# Patient Record
Sex: Female | Born: 1990 | Race: White | Hispanic: No | Marital: Married | Smoking: Current every day smoker
Health system: Southern US, Community
[De-identification: ages and names within clinical notes are randomized; demographics above are authoritative.]

## PROBLEM LIST (undated history)

## (undated) DIAGNOSIS — B1920 Unspecified viral hepatitis C without hepatic coma: Secondary | ICD-10-CM

## (undated) DIAGNOSIS — I38 Endocarditis, valve unspecified: Secondary | ICD-10-CM

## (undated) DIAGNOSIS — F1990 Other psychoactive substance use, unspecified, uncomplicated: Secondary | ICD-10-CM

## (undated) HISTORY — PX: CHOLECYSTECTOMY: SHX55

---

## 2015-09-07 ENCOUNTER — Encounter: Payer: Self-pay | Admitting: Emergency Medicine

## 2015-09-07 ENCOUNTER — Emergency Department: Payer: Self-pay

## 2015-09-07 ENCOUNTER — Emergency Department
Admission: EM | Admit: 2015-09-07 | Discharge: 2015-09-07 | Disposition: A | Discharge status: Home / Self Care | Payer: Self-pay | Attending: Emergency Medicine | Admitting: Emergency Medicine

## 2015-09-07 DIAGNOSIS — J069 Acute upper respiratory infection, unspecified: Secondary | ICD-10-CM | POA: Insufficient documentation

## 2015-09-07 DIAGNOSIS — R079 Chest pain, unspecified: Secondary | ICD-10-CM | POA: Insufficient documentation

## 2015-09-07 DIAGNOSIS — F17200 Nicotine dependence, unspecified, uncomplicated: Secondary | ICD-10-CM | POA: Insufficient documentation

## 2015-09-07 HISTORY — DX: Unspecified viral hepatitis C without hepatic coma: B19.20

## 2015-09-07 HISTORY — DX: Other psychoactive substance use, unspecified, uncomplicated: F19.90

## 2015-09-07 HISTORY — DX: Endocarditis, valve unspecified: I38

## 2015-09-07 LAB — BASIC METABOLIC PANEL
ANION GAP: 6 (ref 5–15)
BUN: 10 mg/dL (ref 6–20)
CALCIUM: 9.1 mg/dL (ref 8.9–10.3)
CHLORIDE: 108 mmol/L (ref 101–111)
CO2: 24 mmol/L (ref 22–32)
Creatinine, Ser: 0.89 mg/dL (ref 0.44–1.00)
GFR calc non Af Amer: >60 mL/min (ref >60)
Glucose, Bld: 91 mg/dL (ref 65–99)
POTASSIUM: 3.7 mmol/L (ref 3.5–5.1)
Sodium: 138 mmol/L (ref 135–145)

## 2015-09-07 LAB — CBC
HEMATOCRIT: 41.9 % (ref 35.0–47.0)
HEMOGLOBIN: 14.1 g/dL (ref 12.0–16.0)
MCH: 29.9 pg (ref 26.0–34.0)
MCHC: 33.6 g/dL (ref 32.0–36.0)
MCV: 88.9 fL (ref 80.0–100.0)
Platelets: 218 10*3/uL (ref 150–440)
RBC: 4.71 MIL/uL (ref 3.80–5.20)
RDW: 12.8 % (ref 11.5–14.5)
WBC: 11.4 10*3/uL — ABNORMAL HIGH (ref 3.6–11.0)

## 2015-09-07 LAB — TROPONIN I

## 2015-09-07 MED ORDER — KETOROLAC TROMETHAMINE 30 MG/ML IJ SOLN
30.0000 mg | Freq: Once | INTRAMUSCULAR | Status: DC
  Filled 2015-09-07: qty 1

## 2015-09-07 MED ORDER — KETOROLAC TROMETHAMINE 30 MG/ML IJ SOLN
30.0000 mg | Freq: Once | INTRAMUSCULAR | Status: AC
  Administered 2015-09-07: 30 mg via INTRAMUSCULAR
  Filled 2015-09-07: qty 1

## 2015-09-07 MED ORDER — BENZONATATE 100 MG PO CAPS: 100.0000 mg | ORAL_CAPSULE | Freq: Four times a day (QID) | ORAL | Status: DC | PRN

## 2015-09-07 MED ORDER — IBUPROFEN 800 MG PO TABS: 800.0000 mg | ORAL_TABLET | Freq: Three times a day (TID) | ORAL | Status: DC | PRN

## 2015-09-07 NOTE — ED Notes (Signed)
3 attempts for labs without success, pt refuses anymore attempts.. MD notified.

## 2015-09-07 NOTE — Discharge Instructions (Signed)
Please drink plenty of fluid to stay well-hydrated. You may take Motrin, with food, for body aches.  Return to the emergency department if you develop fever, severe pain, shortness of breath, lightheadedness or fainting, palpitations, or any other symptoms concerning to you.

## 2015-09-07 NOTE — ED Notes (Signed)
Pt to ed with c/o chest pain that started yesterday.  Pt states constant pain, pressure, "stomping pain".  Pt reports sob associated with the pain.  Pt reports in Dec she was using IV drugs and caused a blood infection and caused [facility]. Reports pain is chest feels similar to pain then.

## 2015-09-07 NOTE — ED Provider Notes (Signed)
[facility] Emergency Department Provider Note  ____________________________________________  Time seen: Approximately 4:34 PM  I have reviewed the triage vital signs and the nursing notes.   HISTORY  Chief Complaint Chest Pain    HPI Katherine Webb is a 25 y.o. 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 The patient reports that for the past several days she has had URI symptoms. She has also had an intermittent left-sided chest pressure that is worse when she coughs. She is not had any palpitations, lightheadedness, syncope, or shortness of breath. No fever or chills.   Past Medical History  Diagnosis Date  . Endocarditis   . IV drug user   . Hepatitis C     There are no active problems to display for this patient.   History reviewed. No pertinent past surgical history.  No current outpatient prescriptions on file.  Allergies Review of patient's allergies indicates no known allergies.  History reviewed. No pertinent family history.  Social History Social History  Substance Use Topics  . Smoking status: Current Every Day Smoker  . Smokeless tobacco: None  . Alcohol Use: Yes    Review of Systems Constitutional: No fever/chills.No lightheadedness or syncope. Eyes: No visual changes. No eye discharge. ENT: Positive sore throat. Positive congestion and rhinorrhea. Bilateral ear discomfort. Cardiovascular: Positive chest pain. Denies palpitations. Respiratory: Denies shortness of breath.  Positive nonproductive cough. Gastrointestinal: No abdominal pain.  No nausea, no vomiting.  No diarrhea.  No constipation. Genitourinary: Negative for dysuria. Musculoskeletal: Negative for back pain. Skin: Negative for rash. Neurological: Negative for headaches. No focal numbness, tingling or weakness.    10-point ROS otherwise negative.  ____________________________________________   PHYSICAL EXAM:  VITAL SIGNS: ED Triage Vitals  Enc Vitals Group     BP 09/07/15 1448 118/87 mmHg     Pulse Rate 09/07/15 1448 112     Resp 09/07/15 1448 20     Temp 09/07/15 1448 98.8 F (37.1 C)     Temp Source 09/07/15 1448 Oral     SpO2 09/07/15 1448 98 %     Weight 09/07/15 1448 123 lb (55.792 kg)     Height 09/07/15 1448  (1.549 m)     Head Cir --      Peak Flow --      Pain Score 09/07/15 1449 8     Pain Loc --      Pain Edu? --      Excl. in GC? --     Constitutional: Alert and oriented. Mildly uncomfortable appearing but nontoxic. Answers questions appropriately. Eyes: Conjunctivae are normal.  EOMI. No scleral icterus. No eye discharge. Head: Atraumatic. Nose: Mild congestion without rhinorrhea. EARS: TMs are clear without evidence of fluid, erythema or bulge. Canals are clear as well. Mouth/Throat: Mucous membranes are moist. No posterior pharyngeal erythema. Posterior palate is symmetric, and uvula is midline. No tonsillar swelling or exudate. Neck: No stridor.  Supple.  No JVD. Cardiovascular: Normal rate, regular rhythm. No murmurs, rubs or gallops.  Respiratory: Normal respiratory effort.  No accessory muscle use or retractions. Lungs CTAB.  No wheezes, rales or ronchi. Gastrointestinal: Soft, nontender and nondistended.  No guarding or rebound.  No peritoneal signs. Musculoskeletal: No LE edema. No ttp in the calves or palpable cords.  Negative Homan's sign. Neurologic:  A&Ox3.  Speech is clear.  Face and  smile are symmetric.  EOMI.  Moves all extremities well. Skin:  Skin is warm, dry and intact. No rash noted. Psychiatric: Mood and affect are normal. Speech and behavior are normal.  Normal judgement.  ____________________________________________   LABS (all labs ordered are listed, but only abnormal results are displayed)  Labs Reviewed  CBC - Abnormal; Notable  for the following:    WBC 11.4 (*)    All other components within normal limits  CULTURE, BLOOD (ROUTINE X 2)  CULTURE, BLOOD (ROUTINE X 2)  BASIC METABOLIC PANEL  TROPONIN I   ____________________________________________  EKG  ED ECG REPORT I, Rockne MenghiniNorman, Anne-Caroline, the attending physician, personally viewed and interpreted this ECG.   Date: 09/07/2015  EKG Time: 1450  Rate: 110  Rhythm: sinus tachycardia  Axis: Normal  Intervals:none  ST&T Change: No ST elevation.  ____________________________________________  RADIOLOGY  Dg Chest 2 View  09/07/2015  CLINICAL DATA:  Left-sided chest pain for 1 day EXAM: CHEST  2 VIEW COMPARISON:  None. FINDINGS: The lungs are clear. Heart size and pulmonary vascularity are normal. No adenopathy. No pneumothorax. There is an old healed fracture of the right clavicle. No acute fracture evident. IMPRESSION: No edema or consolidation. Electronically Signed   By: Bretta BangWilliam  Woodruff III M.D.   On: 09/07/2015 15:37    ____________________________________________   PROCEDURES  Procedure(s) performed: None  Critical Care performed: No ____________________________________________   INITIAL IMPRESSION / ASSESSMENT AND PLAN / ED COURSE  Pertinent labs & imaging results that were available during my care of the patient were reviewed by me and considered in my medical decision making (see chart for details).  25 y.o. female with a recent history of endocarditis has completed her antibiotics presenting with URI symptoms and chest pressure. Overall, the patient is mildly uncomfortable appearing but nontoxic. I do not hear any murmurs on her cardiac auscultation. Her EKG does have sinus tachycardia, but on my exam her heart rate has normalized, and there are no other abnormalities in her conduction. The patient does not have any abnormal findings on her chest x-ray. She does have a mildly elevated white blood cell count, which is consistent with her URI.  Her troponin is negative. Overall, I think the most likely etiology of the patient's chest discomfort is her cough and that it is very unlikely that she has endocarditis. However, as a precautionary measure, I will get blood cultures today, and have her follow-up with cardiology for echocardiogram as needed. We have discussed strict return precautions as well as follow-up instructions.  ----------------------------------------- 4:55 PM on 09/07/2015 -----------------------------------------  I spoke with Dr. Welton FlakesKhan who will follow the patient up tomorrow at 11am.  ____________________________________________  FINAL CLINICAL IMPRESSION(S) / ED DIAGNOSES  Final diagnoses:  URI (upper respiratory infection)  Chest pain, unspecified chest pain type      NEW MEDICATIONS STARTED DURING THIS VISIT:  New Prescriptions   No medications on file     Rockne MenghiniAnne-Caroline Lucrezia Dehne, MD 09/07/15 1656

## 2015-09-18 ENCOUNTER — Encounter: Payer: Self-pay | Admitting: Radiology

## 2015-09-18 ENCOUNTER — Emergency Department
Admission: EM | Admit: 2015-09-18 | Discharge: 2015-09-19 | Disposition: A | Discharge status: Home / Self Care | Payer: Self-pay | Attending: Emergency Medicine | Admitting: Emergency Medicine

## 2015-09-18 ENCOUNTER — Emergency Department: Payer: Self-pay

## 2015-09-18 DIAGNOSIS — R52 Pain, unspecified: Secondary | ICD-10-CM

## 2015-09-18 DIAGNOSIS — N760 Acute vaginitis: Secondary | ICD-10-CM

## 2015-09-18 DIAGNOSIS — F17200 Nicotine dependence, unspecified, uncomplicated: Secondary | ICD-10-CM | POA: Insufficient documentation

## 2015-09-18 DIAGNOSIS — O23591 Infection of other part of genital tract in pregnancy, first trimester: Secondary | ICD-10-CM | POA: Insufficient documentation

## 2015-09-18 DIAGNOSIS — Z3490 Encounter for supervision of normal pregnancy, unspecified, unspecified trimester: Secondary | ICD-10-CM

## 2015-09-18 DIAGNOSIS — B9689 Other specified bacterial agents as the cause of diseases classified elsewhere: Secondary | ICD-10-CM

## 2015-09-18 DIAGNOSIS — Z3A09 9 weeks gestation of pregnancy: Secondary | ICD-10-CM | POA: Insufficient documentation

## 2015-09-18 LAB — COMPREHENSIVE METABOLIC PANEL
ALBUMIN: 4.5 g/dL (ref 3.5–5.0)
ALT: 99 U/L — AB (ref 14–54)
AST: 57 U/L — AB (ref 15–41)
Alkaline Phosphatase: 78 U/L (ref 38–126)
Anion gap: 8 (ref 5–15)
BUN: 17 mg/dL (ref 6–20)
CALCIUM: 9.8 mg/dL (ref 8.9–10.3)
CHLORIDE: 103 mmol/L (ref 101–111)
CO2: 25 mmol/L (ref 22–32)
CREATININE: 0.9 mg/dL (ref 0.44–1.00)
GFR calc Af Amer: >60 mL/min (ref >60)
GFR calc non Af Amer: >60 mL/min (ref >60)
GLUCOSE: 73 mg/dL (ref 65–99)
Potassium: 4.2 mmol/L (ref 3.5–5.1)
SODIUM: 136 mmol/L (ref 135–145)
TOTAL PROTEIN: 7.9 g/dL (ref 6.5–8.1)
Total Bilirubin: 0.2 mg/dL — ABNORMAL LOW (ref 0.3–1.2)

## 2015-09-18 LAB — URINALYSIS COMPLETE WITH MICROSCOPIC (ARMC ONLY)
BILIRUBIN URINE: NEGATIVE
Bacteria, UA: NONE SEEN
GLUCOSE, UA: NEGATIVE mg/dL
HGB URINE DIPSTICK: NEGATIVE
Ketones, ur: NEGATIVE mg/dL
LEUKOCYTES UA: NEGATIVE
NITRITE: NEGATIVE
Protein, ur: NEGATIVE mg/dL
RBC / HPF: NONE SEEN RBC/hpf (ref 0–5)
SPECIFIC GRAVITY, URINE: 1.021 (ref 1.005–1.030)
Squamous Epithelial / LPF: NONE SEEN
pH: 5 (ref 5.0–8.0)

## 2015-09-18 LAB — HCG, QUANTITATIVE, PREGNANCY: HCG, BETA CHAIN, QUANT, S: 31825 m[IU]/mL — AB (ref <5)

## 2015-09-18 LAB — CBC
HEMATOCRIT: 41.1 % (ref 35.0–47.0)
Hemoglobin: 13.9 g/dL (ref 12.0–16.0)
MCH: 29.6 pg (ref 26.0–34.0)
MCHC: 33.9 g/dL (ref 32.0–36.0)
MCV: 87.4 fL (ref 80.0–100.0)
PLATELETS: 232 10*3/uL (ref 150–440)
RBC: 4.7 MIL/uL (ref 3.80–5.20)
RDW: 13.1 % (ref 11.5–14.5)
WBC: 12.2 10*3/uL — AB (ref 3.6–11.0)

## 2015-09-18 LAB — LIPASE, BLOOD: LIPASE: 40 U/L (ref 11–51)

## 2015-09-18 NOTE — ED Notes (Signed)
Pt reports she is approx [redacted] weeks pregnant and has been having lower abd cramping but no bleeding. Pt reports she is concerned for a tubal pregnancy. 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

## 2015-09-18 NOTE — ED Provider Notes (Signed)
[facility] Emergency Department Provider Note   ____________________________________________  Time seen: Approximately 11:19 PM  I have reviewed the triage vital signs and the nursing notes.   HISTORY  Chief Complaint Abdominal Pain    HPI Katherine Webb is a 25 y.o. female who presents to the ED from home with a chief complaint of pelvic pain. Patient is a G4 P1 Ab2 approximately [redacted] weeks pregnant who has been experiencing lower abdominal cramping without bleeding for the past 2 weeks. Loc sexual intercourse approximately 2 weeks ago. Also thinks she is having yeast infection secondary to vaginal discharge. Denies associated fever, chills, chest pain, shortness of breath, nausea, vomiting, dysuria, diarrhea. Nothing makes her symptoms better or worse.   Past Medical History  Diagnosis Date  . Endocarditis   . IV drug user   . Hepatitis C     There are no active problems to display for this patient.   No past surgical history on file.  Current Outpatient Rx  Name  Route  Sig  Dispense  Refill  . benzonatate (TESSALON PERLES) 100 MG capsule   Oral   Take 1 capsule (100 mg total) by mouth every 6 (six) hours as needed for cough.   15 capsule   0   . ibuprofen (ADVIL,MOTRIN) 800 MG tablet   Oral   Take 1 tablet (800 mg total) by mouth every 8 (eight) hours as needed.   20 tablet   0   . metroNIDAZOLE (METROGEL VAGINAL) 0.75 % vaginal gel   Vaginal   Place 1 Applicatorful vaginally at bedtime.   70 g   1     Allergies Review of patient's allergies indicates no known allergies.  No family history on file.  Social History Social History  Substance Use Topics  . Smoking status: Current Every Day Smoker  . Smokeless tobacco: Not on file  . Alcohol Use: Yes    Review of Systems  Constitutional: No fever/chills. Eyes: No visual changes. ENT: No sore throat. Cardiovascular: Denies chest pain. Respiratory: Denies shortness of  breath. Gastrointestinal: Positive for pelvic pain.  No nausea, no vomiting.  No diarrhea.  No constipation. Genitourinary: Positive for vaginal discharge. Negative for dysuria. Musculoskeletal: Negative for back pain. Skin: Negative for rash. Neurological: Negative for headaches, focal weakness or numbness.  10-point ROS otherwise negative.  ____________________________________________   PHYSICAL EXAM:  VITAL SIGNS: ED Triage Vitals  Enc Vitals Group     BP 09/18/15 2103 110/79 mmHg     Pulse Rate 09/18/15 2103 96     Resp 09/18/15 2103 18     Temp 09/18/15 2103 98.7 F (37.1 C)     Temp Source 09/18/15 2103 Oral     SpO2 09/18/15 2103 100 %     Weight 09/18/15 2103 128 lb (58.06 kg)     Height 09/18/15 2103 5\' 2"  (1.575 m)     Head Cir --      Peak Flow --      Pain Score 09/18/15 2104 8     Pain Loc --      Pain Edu? --      Excl. in GC? --     Constitutional: Alert and oriented. Well appearing and in no acute distress. Eyes: Conjunctivae are normal. PERRL. EOMI. Head: Atraumatic. Nose: No congestion/rhinnorhea. Mouth/Throat: Mucous membranes are moist.  Oropharynx non-erythematous. Neck: No stridor.   Cardiovascular: Normal rate, regular rhythm. Grossly normal heart sounds.  Good peripheral circulation. Respiratory: Normal respiratory  effort.  No retractions. Lungs CTAB. Gastrointestinal: Soft and nontender. No distention. No abdominal bruits. No CVA tenderness. Musculoskeletal: No lower extremity tenderness nor edema.  No joint effusions. Neurologic:  Normal speech and language. No gross focal neurologic deficits are appreciated. No gait instability. Skin:  Skin is warm, dry and intact. No rash noted. Psychiatric: Mood and affect are normal. Speech and behavior are normal.  ____________________________________________   LABS (all labs ordered are listed, but only abnormal results are displayed)  Labs Reviewed  WET PREP, GENITAL - Abnormal; Notable for the  following:    Clue Cells Wet Prep HPF POC PRESENT (*)    WBC, Wet Prep HPF POC FEW (*)    All other components within normal limits  COMPREHENSIVE METABOLIC PANEL - Abnormal; Notable for the following:    AST 57 (*)    ALT 99 (*)    Total Bilirubin 0.2 (*)    All other components within normal limits  CBC - Abnormal; Notable for the following:    WBC 12.2 (*)    All other components within normal limits  URINALYSIS COMPLETEWITH MICROSCOPIC ([facility] ONLY) - Abnormal; Notable for the following:    Color, Urine YELLOW (*)    APPearance CLEAR (*)    All other components within normal limits  HCG, QUANTITATIVE, PREGNANCY - Abnormal; Notable for the following:    hCG, Beta Chain, Quant, S 31825 (*)    All other components within normal limits  CHLAMYDIA/NGC RT PCR ([facility] ONLY)  LIPASE, BLOOD   ____________________________________________  EKG  None ____________________________________________  RADIOLOGY  OB ultrasound interpreted per Dr. Manus Gunning: Single live intrauterine pregnancy estimated gestational age [redacted] weeks 6 days for 05/14/2016. No complication, however this is discordant with dates based on last menstrual period and continued follow-up is recommended. ____________________________________________   PROCEDURES  Procedure(s) performed:   Pelvic exam: External exam WNL without rashes, lesions or vesicles. Speculum exam reveals no bleeding, slight white malodorous discharge. Cervical os closed. Bimanual exam WNL.  Critical Care performed: No  ____________________________________________   INITIAL IMPRESSION / ASSESSMENT AND PLAN / ED COURSE  Pertinent labs & imaging results that were available during my care of the patient were reviewed by me and considered in my medical decision making (see chart for details).  25 year old female approximately [redacted] weeks pregnant by dates who presents with pelvic pain without bleeding. Laboratory and urinalysis results  unremarkable; will proceed with OB ultrasound.  ----------------------------------------- 2:16 AM on 09/19/2015 -----------------------------------------  Updated patient of imaging results. Plan for MetroGel vaginal inserts for bacteria vaginosis and follow-up with GYN next week. Strict return precautions given. Patient verbalizes understanding and agrees with plan of care. ____________________________________________   FINAL CLINICAL IMPRESSION(S) / ED DIAGNOSES  Final diagnoses:  Pain  Pregnancy  Bacterial vaginosis      NEW MEDICATIONS STARTED DURING THIS VISIT:  New Prescriptions   METRONIDAZOLE (METROGEL VAGINAL) 0.75 % VAGINAL GEL    Place 1 Applicatorful vaginally at bedtime.     Note:  This document was prepared using Dragon voice recognition software and may include unintentional dictation errors.    Irean Hong, MD 09/19/15 [phone]

## 2015-09-18 NOTE — ED Notes (Signed)
Patient transported to Ultrasound 

## 2015-09-19 LAB — CHLAMYDIA/NGC RT PCR (ARMC ONLY)
Chlamydia Tr: NOT DETECTED
N gonorrhoeae: NOT DETECTED

## 2015-09-19 LAB — WET PREP, GENITAL
SPERM: NONE SEEN
Trich, Wet Prep: NONE SEEN
Yeast Wet Prep HPF POC: NONE SEEN

## 2015-09-19 MED ORDER — METRONIDAZOLE 0.75 % VA GEL: 1.0000 | Freq: Every day | VAGINAL | Status: DC

## 2015-09-19 NOTE — ED Notes (Signed)

## 2015-09-19 NOTE — ED Notes (Signed)
Pt reports lower abd cramping pain for several days. States noticed since finding out she is pregnant. Pt reports she is approx [redacted] weeks pregnant. Pt denies vaginal bleeding but does report a vaginal discharge that she describes as like a yeast infection. Pt to early to obtain FHT.

## 2015-09-19 NOTE — Discharge Instructions (Signed)
1. Insert MetroGel vaginal inserts nightly 5 nights. 2. Return to the ER for worsening symptoms, vaginal bleeding, persistent vomiting, difficulty breathing or other concerns.  First Trimester of Pregnancy The first trimester of pregnancy is from week 1 until the end of week 12 (months 1 through 3). A week after a sperm fertilizes an egg, the egg will implant on the wall of the uterus. This embryo will begin to develop into a baby. Genes from you and your partner are forming the baby. The female genes determine whether the baby is a boy or a girl. At 6-8 weeks, the eyes and face are formed, and the heartbeat can be seen on ultrasound. At the end of 12 weeks, all the baby's organs are formed.  Now that you are pregnant, you will want to do everything you can to have a healthy baby. Two of the most important things are to get good prenatal care and to follow your health care provider's instructions. Prenatal care is all the medical care you receive before the baby's birth. This care will help prevent, find, and treat any problems during the pregnancy and childbirth. BODY CHANGES Your body goes through many changes during pregnancy. The changes vary from woman to woman.   You may gain or lose a couple of pounds at first.  You may feel sick to your stomach (nauseous) and throw up (vomit). If the vomiting is uncontrollable, call your health care provider.  You may tire easily.  You may develop headaches that can be relieved by medicines approved by your health care provider.  You may urinate more often. Painful urination may mean you have a bladder infection.  You may develop heartburn as a result of your pregnancy.  You may develop constipation because certain hormones are causing the muscles that push waste through your intestines to slow down.  You may develop hemorrhoids or swollen, bulging veins (varicose veins).  Your breasts may begin to grow larger and become tender. Your nipples may stick  out more, and the tissue that surrounds them (areola) may become darker.  Your gums may bleed and may be sensitive to brushing and flossing.  Dark spots or blotches (chloasma, mask of pregnancy) may develop on your face. This will likely fade after the baby is born.  Your menstrual periods will stop.  You may have a loss of appetite.  You may develop cravings for certain kinds of food.  You may have changes in your emotions from day to day, such as being excited to be pregnant or being concerned that something may go wrong with the pregnancy and baby.  You may have more vivid and strange dreams.  You may have changes in your hair. These can include thickening of your hair, rapid growth, and changes in texture. Some women also have hair loss during or after pregnancy, or hair that feels dry or thin. Your hair will most likely return to normal after your baby is born. WHAT TO EXPECT AT YOUR PRENATAL VISITS During a routine prenatal visit:  You will be weighed to make sure you and the baby are growing normally.  Your blood pressure will be taken.  Your abdomen will be measured to track your baby's growth.  The fetal heartbeat will be listened to starting around week 10 or 12 of your pregnancy.  Test results from any previous visits will be discussed. Your health care provider may ask you:  How you are feeling.  If you are feeling the baby move.  If you have had any abnormal symptoms, such as leaking fluid, bleeding, severe headaches, or abdominal cramping.  If you are using any tobacco products, including cigarettes, chewing tobacco, and electronic cigarettes.  If you have any questions. Other tests that may be performed during your first trimester include:  Blood tests to find your blood type and to check for the presence of any previous infections. They will also be used to check for low iron levels (anemia) and Rh antibodies. Later in the pregnancy, blood tests for diabetes  will be done along with other tests if problems develop.  Urine tests to check for infections, diabetes, or protein in the urine.  An ultrasound to confirm the proper growth and development of the baby.  An amniocentesis to check for possible genetic problems.  Fetal screens for spina bifida and Down syndrome.  You may need other tests to make sure you and the baby are doing well.  HIV (human immunodeficiency virus) testing. Routine prenatal testing includes screening for HIV, unless you choose not to have this test. HOME CARE INSTRUCTIONS  Medicines  Follow your health care provider's instructions regarding medicine use. Specific medicines may be either safe or unsafe to take during pregnancy.  Take your prenatal vitamins as directed.  If you develop constipation, try taking a stool softener if your health care provider approves. Diet  Eat regular, well-balanced meals. Choose a variety of foods, such as meat or vegetable-based protein, fish, milk and low-fat dairy products, vegetables, fruits, and whole grain breads and cereals. Your health care provider will help you determine the amount of weight gain that is right for you.  Avoid raw meat and uncooked cheese. These carry germs that can cause birth defects in the baby.  Eating four or five small meals rather than three large meals a day may help relieve nausea and vomiting. If you start to feel nauseous, eating a few soda crackers can be helpful. Drinking liquids between meals instead of during meals also seems to help nausea and vomiting.  If you develop constipation, eat more high-fiber foods, such as fresh vegetables or fruit and whole grains. Drink enough fluids to keep your urine clear or pale yellow. Activity and Exercise  Exercise only as directed by your health care provider. Exercising will help you:  Control your weight.  Stay in shape.  Be prepared for labor and delivery.  Experiencing pain or cramping in the  lower abdomen or low back is a good sign that you should stop exercising. Check with your health care provider before continuing normal exercises.  Try to avoid standing for long periods of time. Move your legs often if you must stand in one place for a long time.  Avoid heavy lifting.  Wear low-heeled shoes, and practice good posture.  You may continue to have sex unless your health care provider directs you otherwise. Relief of Pain or Discomfort  Wear a good support bra for breast tenderness.   Take warm sitz baths to soothe any pain or discomfort caused by hemorrhoids. Use hemorrhoid cream if your health care provider approves.   Rest with your legs elevated if you have leg cramps or low back pain.  If you develop varicose veins in your legs, wear support hose. Elevate your feet for 15 minutes, 3-4 times a day. Limit salt in your diet. Prenatal Care  Schedule your prenatal visits by the twelfth week of pregnancy. They are usually scheduled monthly at first, then more often in the last  2 months before delivery.  Write down your questions. Take them to your prenatal visits.  Keep all your prenatal visits as directed by your health care provider. Safety  Wear your seat belt at all times when driving.  Make a list of emergency phone numbers, including numbers for family, friends, the hospital, and police and fire departments. General Tips  Ask your health care provider for a referral to a local prenatal education class. Begin classes no later than at the beginning of month 6 of your pregnancy.  Ask for help if you have counseling or nutritional needs during pregnancy. Your health care provider can offer advice or refer you to specialists for help with various needs.  Do not use hot tubs, steam rooms, or saunas.  Do not douche or use tampons or scented sanitary pads.  Do not cross your legs for long periods of time.  Avoid cat litter boxes and soil used by cats. These carry  germs that can cause birth defects in the baby and possibly loss of the fetus by miscarriage or stillbirth.  Avoid all smoking, herbs, alcohol, and medicines not prescribed by your health care provider. Chemicals in these affect the formation and growth of the baby.  Do not use any tobacco products, including cigarettes, chewing tobacco, and electronic cigarettes. If you need help quitting, ask your health care provider. You may receive counseling support and other resources to help you quit.  Schedule a dentist appointment. At home, brush your teeth with a soft toothbrush and be gentle when you floss. SEEK MEDICAL CARE IF:   You have dizziness.  You have mild pelvic cramps, pelvic pressure, or nagging pain in the abdominal area.  You have persistent nausea, vomiting, or diarrhea.  You have a bad smelling vaginal discharge.  You have pain with urination.  You notice increased swelling in your face, hands, legs, or ankles. SEEK IMMEDIATE MEDICAL CARE IF:   You have a fever.  You are leaking fluid from your vagina.  You have spotting or bleeding from your vagina.  You have severe abdominal cramping or pain.  You have rapid weight gain or loss.  You vomit blood or material that looks like coffee grounds.  You are exposed to [place] measles and have never had them.  You are exposed to fifth disease or chickenpox.  You develop a severe headache.  You have shortness of breath.  You have any kind of trauma, such as from a fall or a car accident.   This information is not intended to replace advice given to you by your health care provider. Make sure you discuss any questions you have with your health care provider.   Document Released: 03/14/2001 Document Revised: 04/10/2014 Document Reviewed: 01/28/2013 Elsevier Interactive Patient Education 2016 [employer] Inc[employer]  Bacterial Vaginosis Bacterial vaginosis is a vaginal infection that occurs when the normal balance of bacteria in  the vagina is disrupted. It results from an overgrowth of certain bacteria. This is the most common vaginal infection in women of childbearing age. Treatment is important to prevent complications, especially in pregnant women, as it can cause a premature delivery. CAUSES  Bacterial vaginosis is caused by an increase in harmful bacteria that are normally present in smaller amounts in the vagina. Several different kinds of bacteria can cause bacterial vaginosis. However, the reason that the condition develops is not fully understood. RISK FACTORS Certain activities or behaviors can put you at an increased risk of developing bacterial vaginosis, including:  Having a  new sex partner or multiple sex partners.  Douching.  Using an intrauterine device (IUD) for contraception. Women do not get bacterial vaginosis from toilet seats, bedding, swimming pools, or contact with objects around them. SIGNS AND SYMPTOMS  Some women with bacterial vaginosis have no signs or symptoms. Common symptoms include:  Grey vaginal discharge.  A fishlike odor with discharge, especially after sexual intercourse.  Itching or burning of the vagina and vulva.  Burning or pain with urination. DIAGNOSIS  Your health care provider will take a medical history and examine the vagina for signs of bacterial vaginosis. A sample of vaginal fluid may be taken. Your health care provider will look at this sample under a microscope to check for bacteria and abnormal cells. A vaginal pH test may also be done.  TREATMENT  Bacterial vaginosis may be treated with antibiotic medicines. These may be given in the form of a pill or a vaginal cream. A second round of antibiotics may be prescribed if the condition comes back after treatment. Because bacterial vaginosis increases your risk for sexually transmitted diseases, getting treated can help reduce your risk for chlamydia, gonorrhea, HIV, and herpes. HOME CARE INSTRUCTIONS   Only take  over-the-counter or prescription medicines as directed by your health care provider.  If antibiotic medicine was prescribed, take it as directed. Make sure you finish it even if you start to feel better.  Tell all sexual partners that you have a vaginal infection. They should see their health care provider and be treated if they have problems, such as a mild rash or itching.  During treatment, it is important that you follow these instructions:  Avoid sexual activity or use condoms correctly.  Do not douche.  Avoid alcohol as directed by your health care provider.  Avoid breastfeeding as directed by your health care provider. SEEK MEDICAL CARE IF:   Your symptoms are not improving after 3 days of treatment.  You have increased discharge or pain.  You have a fever. MAKE SURE YOU:   Understand these instructions.  Will watch your condition.  Will get help right away if you are not doing well or get worse. FOR MORE INFORMATION  Centers for Disease Control and Prevention, Division of STD Prevention: [employer] American Sexual Health Association (ASHA): www.ashastd.org    This information is not intended to replace advice given to you by your health care provider. Make sure you discuss any questions you have with your health care provider.   Document Released: 03/20/2005 Document Revised: 04/10/2014 Document Reviewed: 10/30/2012 Elsevier Interactive Patient Education [employer]

## 2017-01-06 IMAGING — US US OB TRANSVAGINAL
1 series · 14 of 28 positions shown · non-contrast
Comparison: None.

CLINICAL DATA: Pregnant patient in first-trimester pregnancy with
vaginal bleeding and lower abdominal cramping. Patient is 8 weeks 3
days by last menstrual period.

EXAM:
OBSTETRIC <14 WK US AND TRANSVAGINAL OB US
TECHNIQUE: Both transabdominal and transvaginal ultrasound examinations were
performed for complete evaluation of the gestation as well as the
maternal uterus, adnexal regions, and pelvic cul-de-sac.
Transvaginal technique was performed to assess early pregnancy.

[Series 1: us ob transvaginal · 0.17mm/px · 106 acquisitions, 14 frames shown]
[im 4/106]
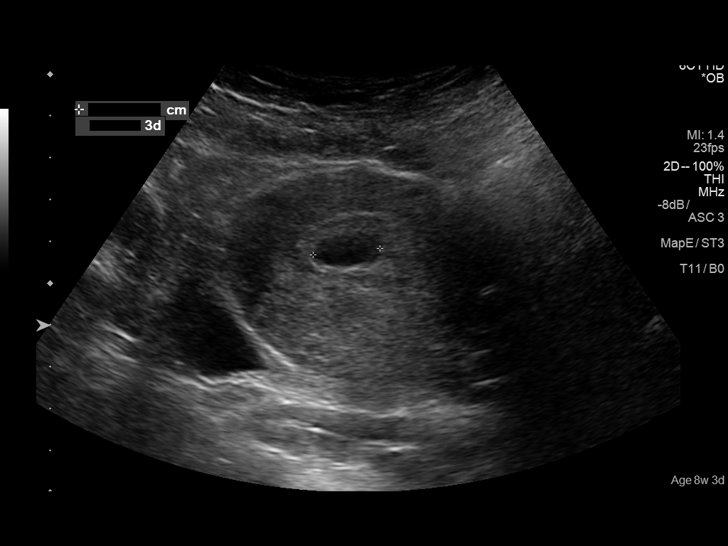
[im 12/106]
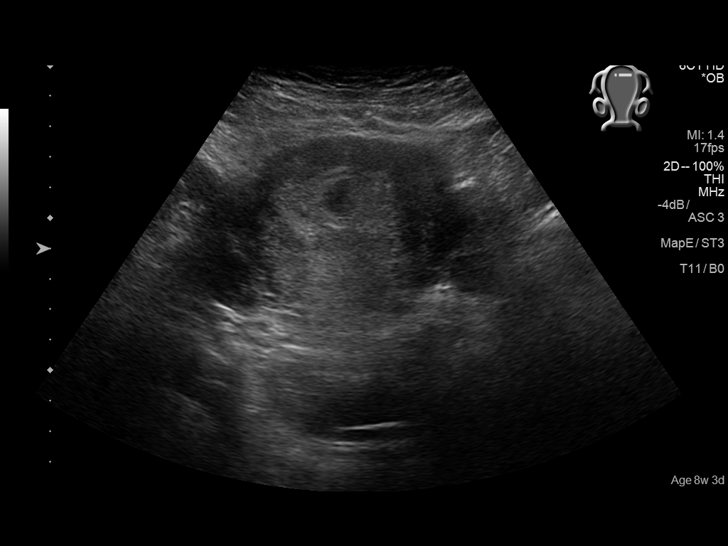
[im 20/106]
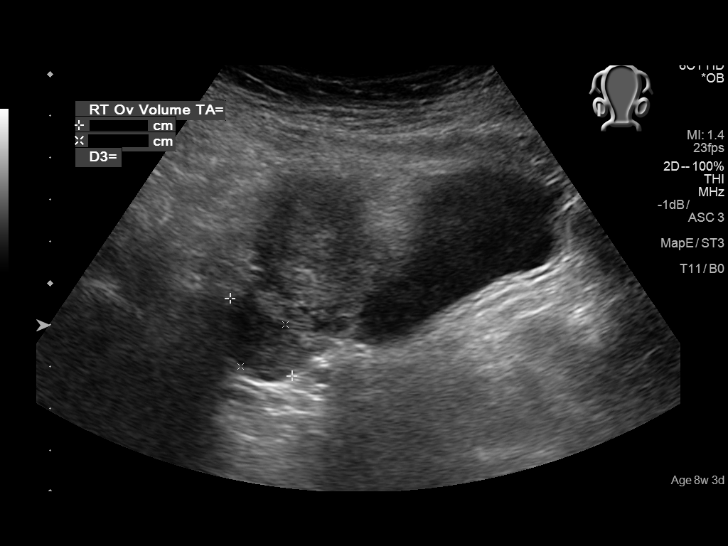
[im 28/106]
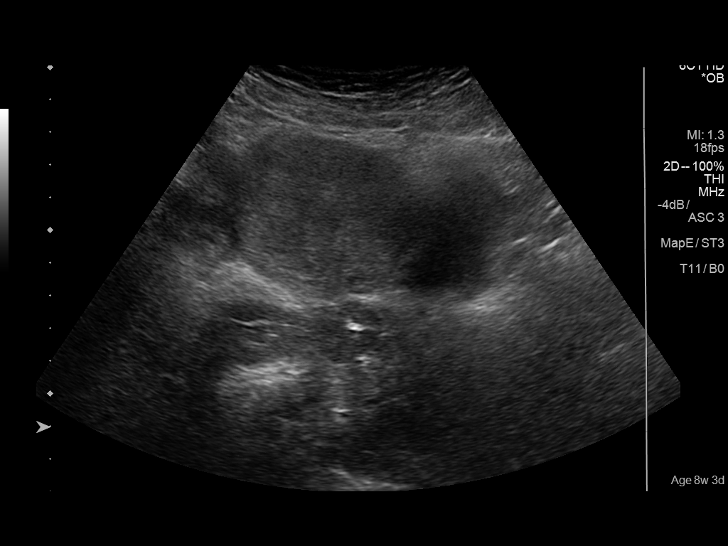
[im 36/106]
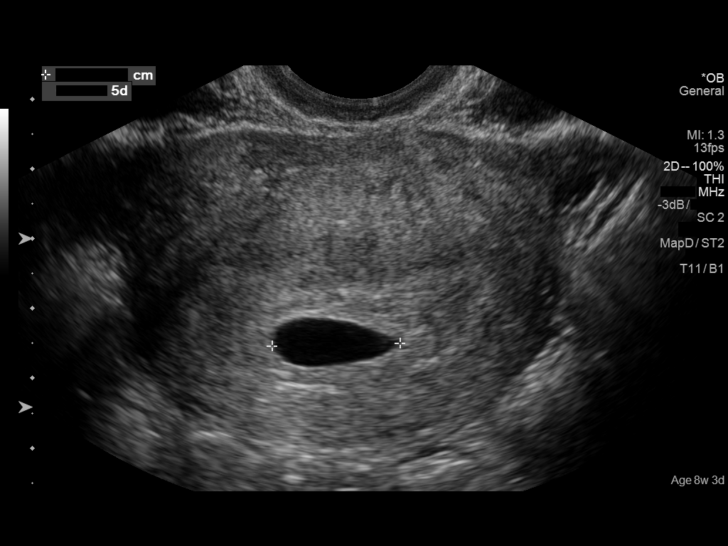
[im 43/106]
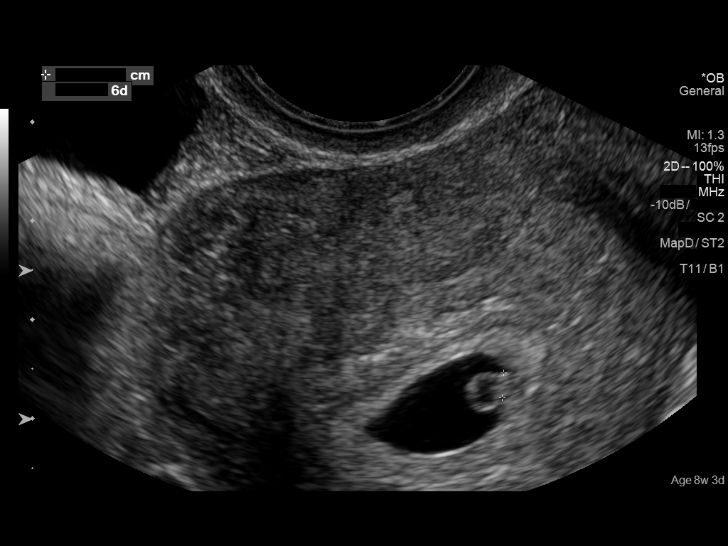
[im 51/106]
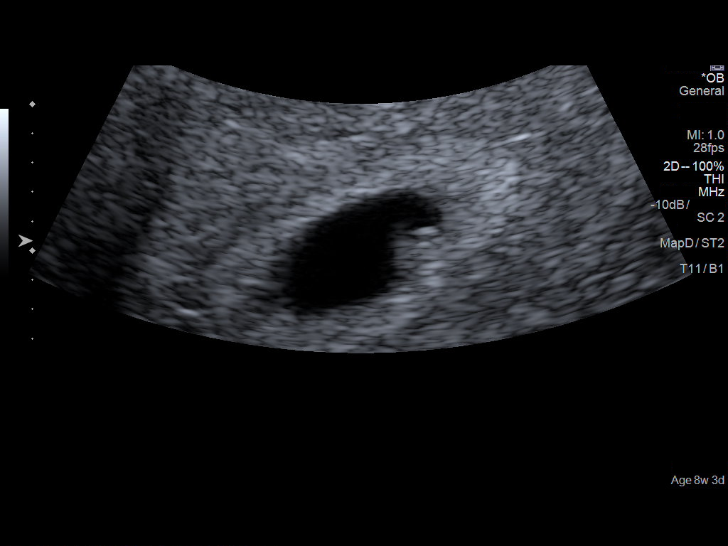
[im 59/106]
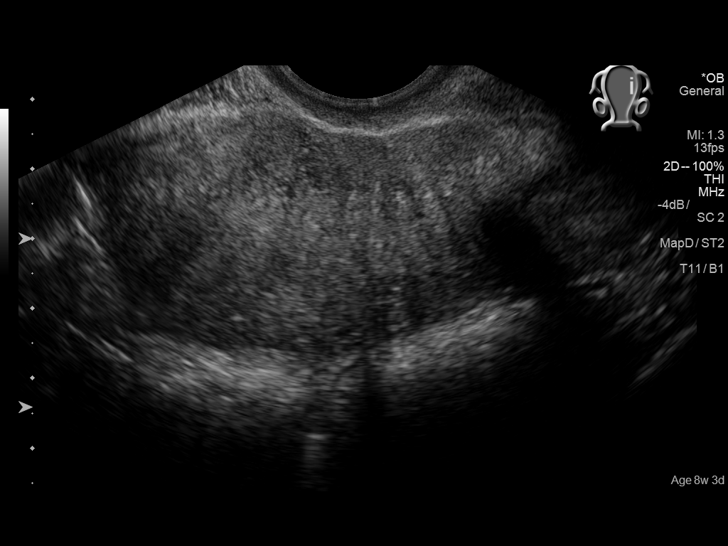
[im 67/106]
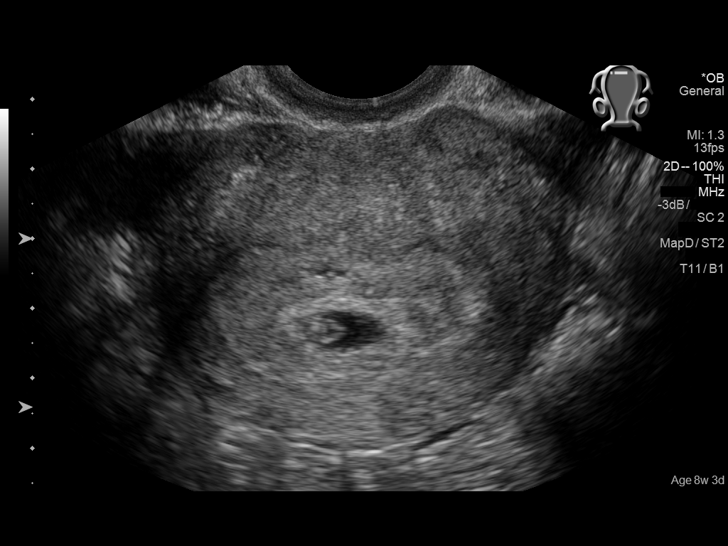
[im 74/106]
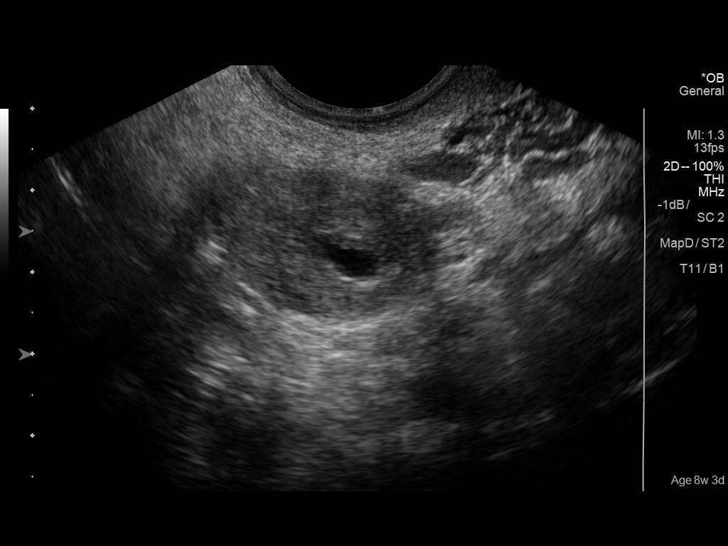
[im 82/106]
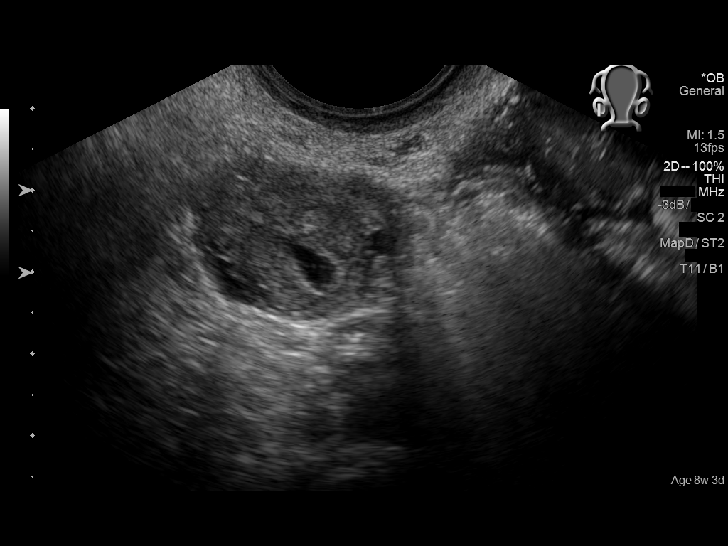
[im 90/106]
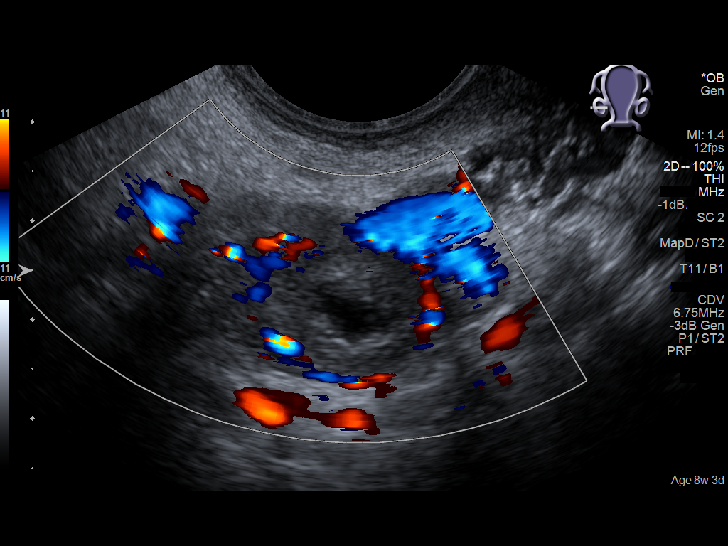
[im 98/106]
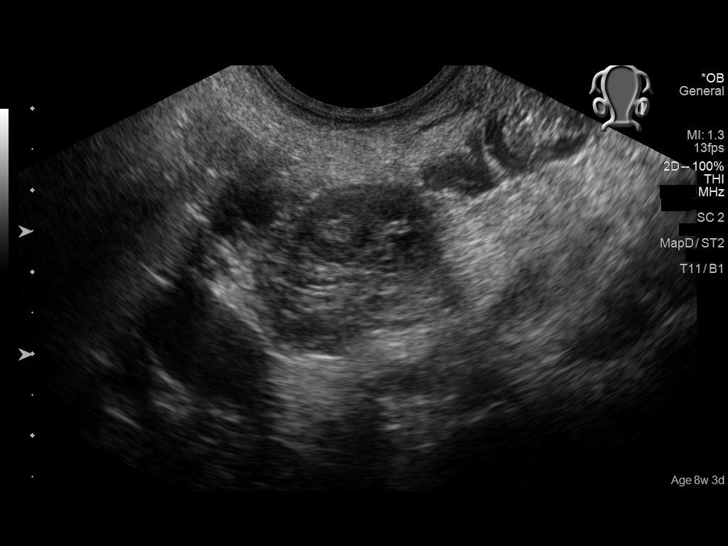
[im 106/106]
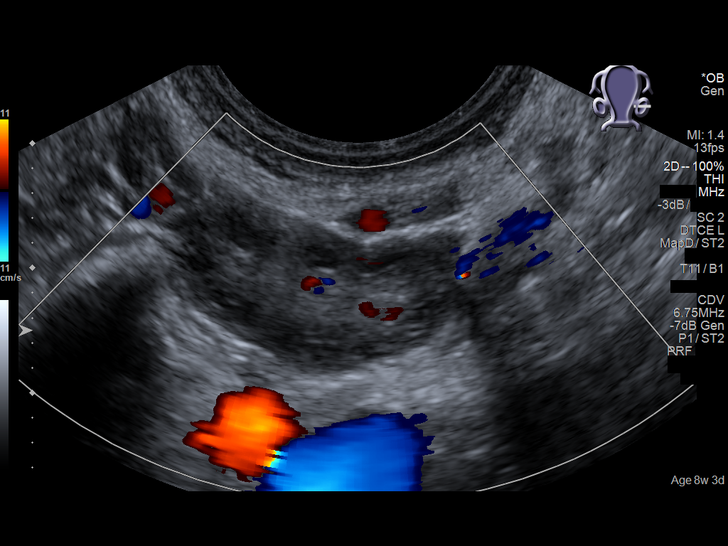

[14 of 28 positions shown; findings below may reference images not displayed]

FINDINGS: Intrauterine gestational sac: Single

Yolk sac:  Present.

Embryo:  Present.

Cardiac Activity: Present.

Heart Rate: 115  bpm

CRL:  3  mm   5 w   6 d                  US EDC: 05/14/2016

Subchorionic hemorrhage:  None visualized.

Maternal uterus/adnexae: There is a probable corpus luteal cyst in
the right ovary. Normal blood flow is seen. Left ovary is normal.
Trace pelvic free fluid.
IMPRESSION: Single live intrauterine pregnancy estimated gestational age 5 weeks
6 days for 05/14/2016. No complication, however this is discordant
with dates based on last menstrual period and continued follow-up is
recommended.

## 2017-01-15 IMAGING — CR DG CHEST 2V
1 series · 2 of 2 positions shown · non-contrast
Comparison: None.

CLINICAL DATA: Left-sided chest pain for 1 day

EXAM:
CHEST  2 VIEW

[Series 1: dg chest 2 view · 0.14mm/px · 2 of 2 slices shown]
[im 1/2]
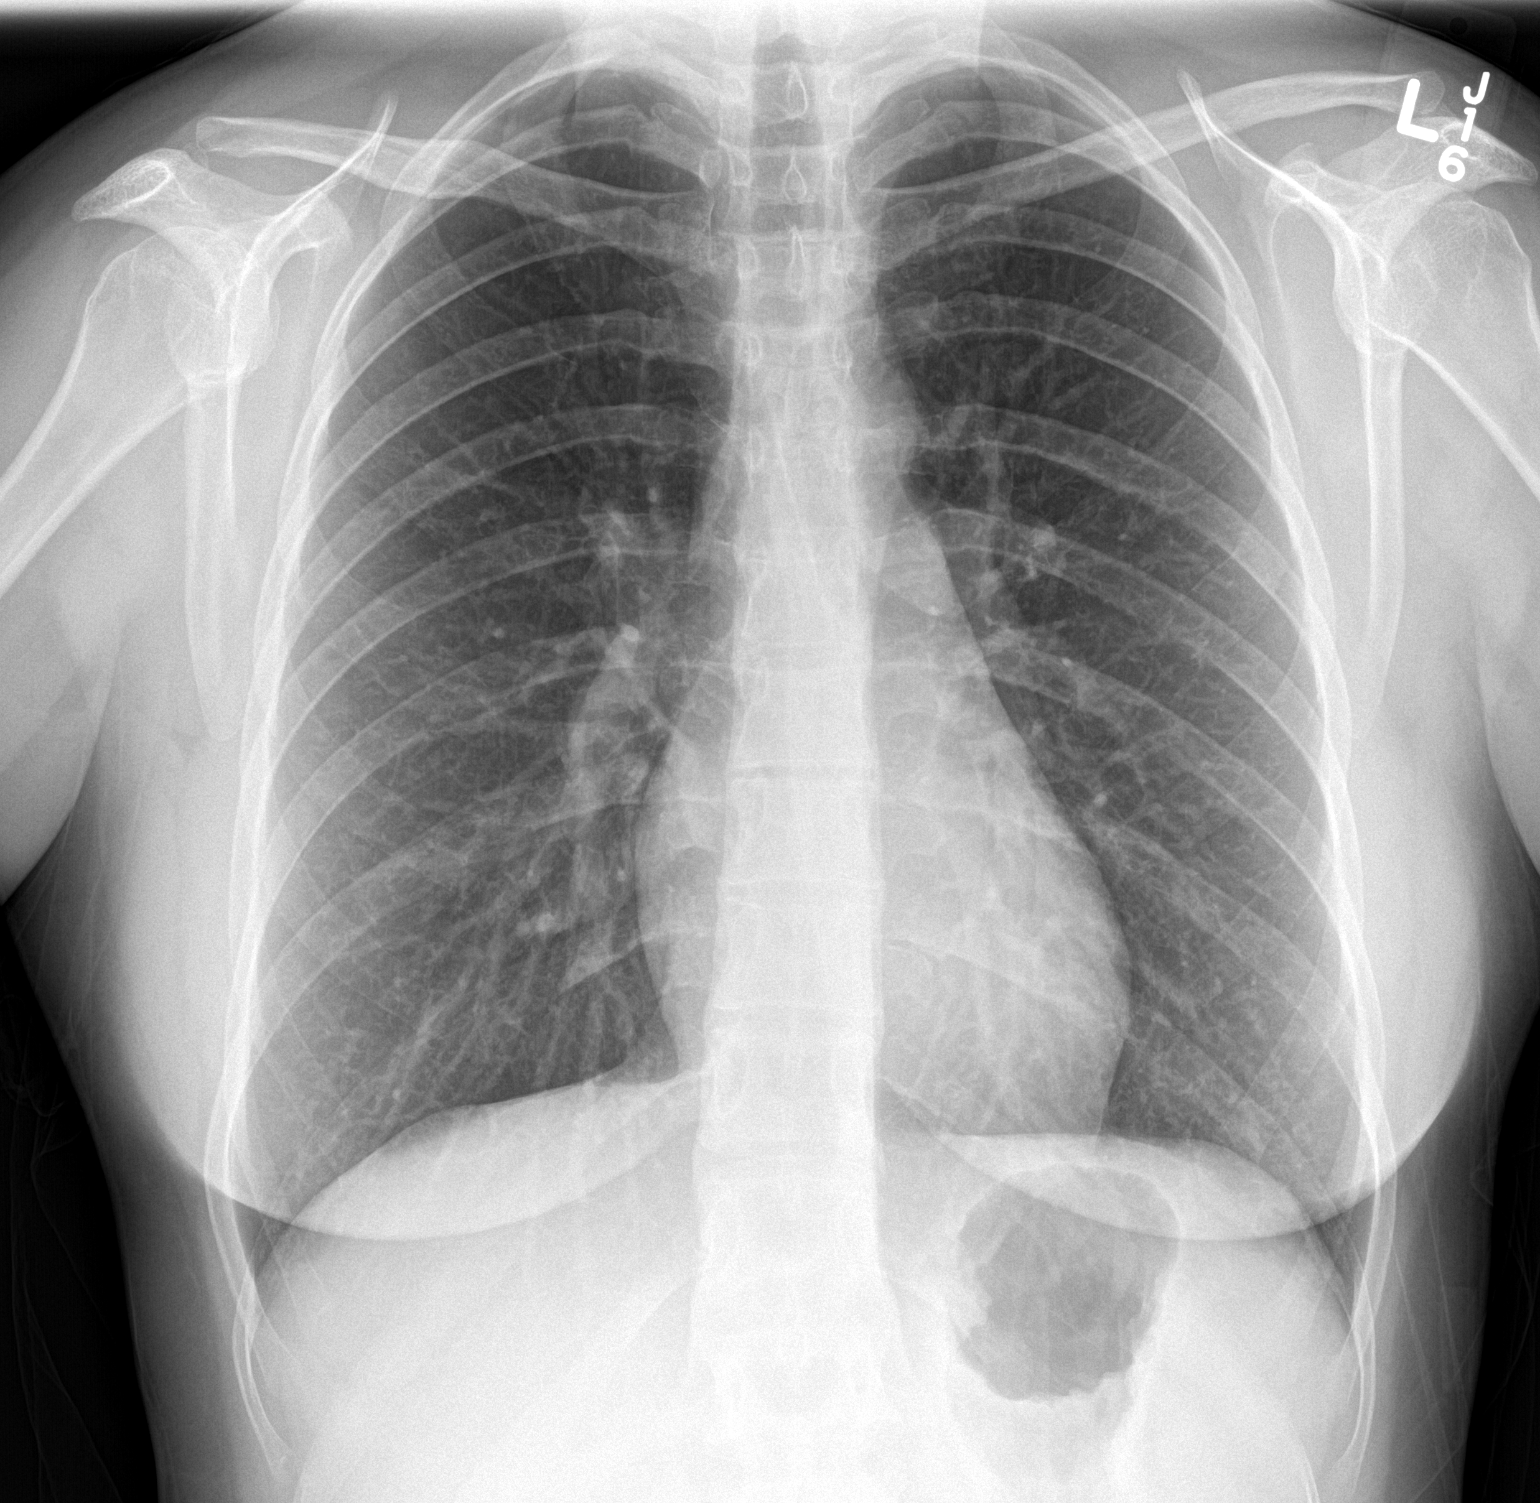
[im 2/2]
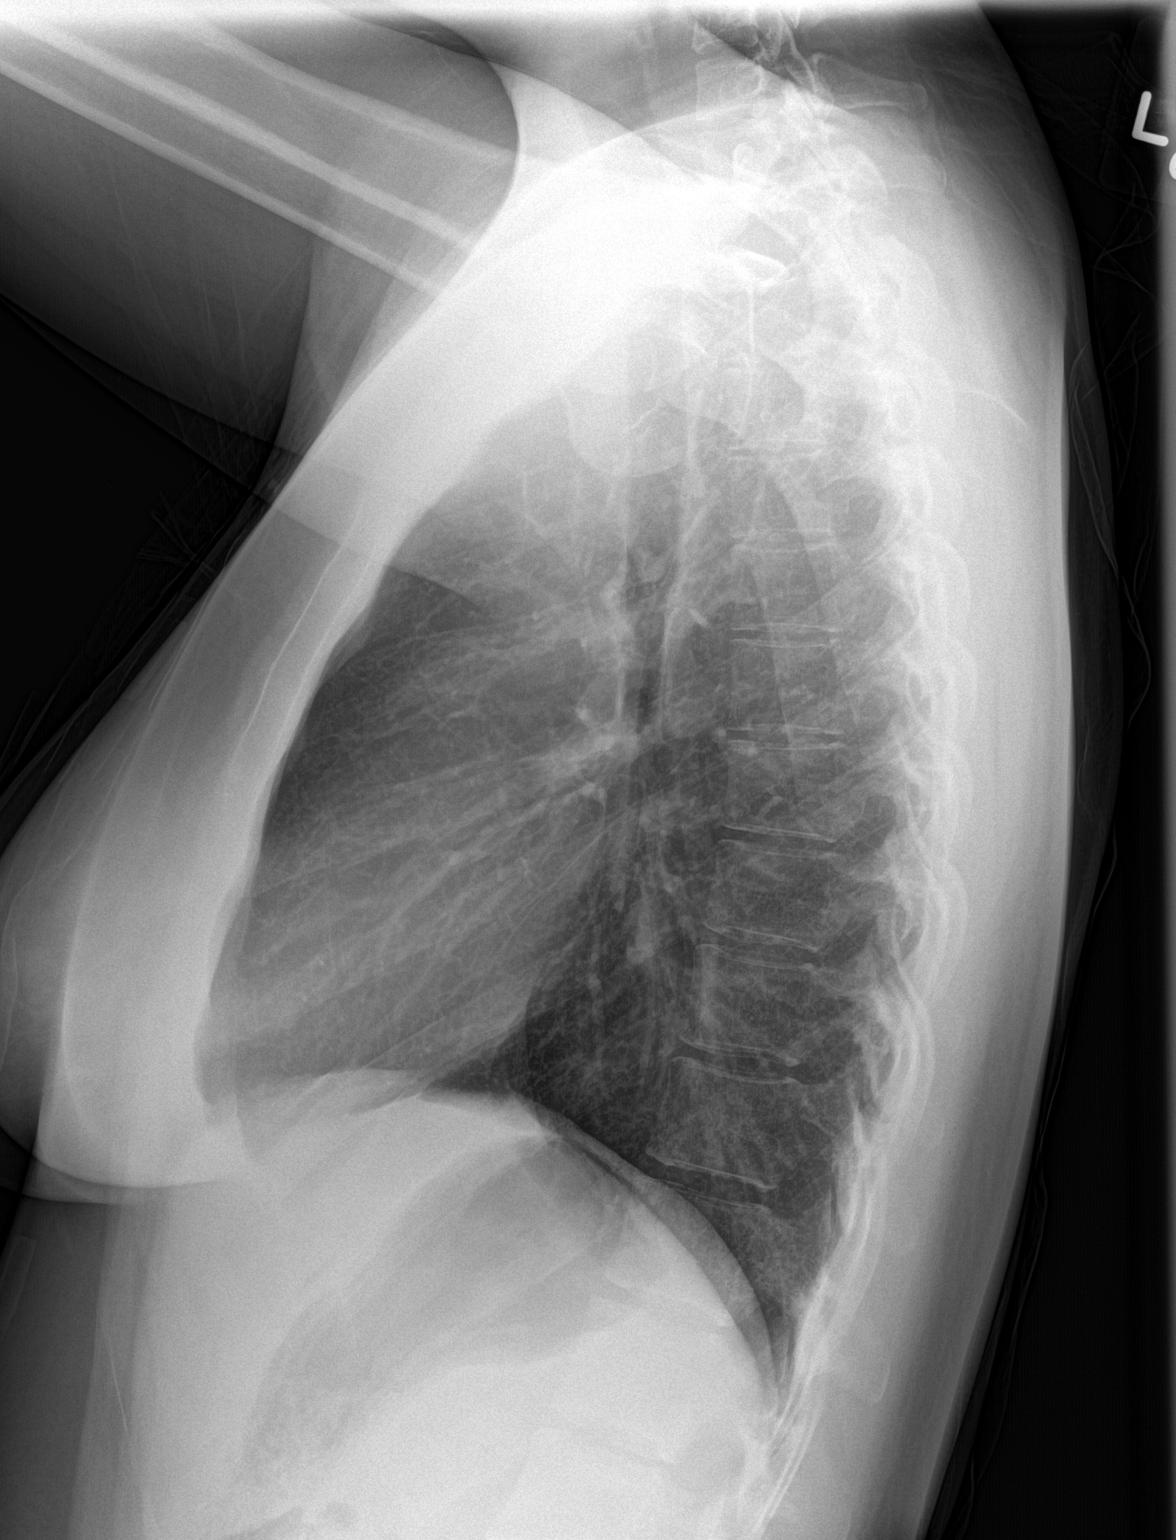

[2 of 2 positions shown; findings below may reference images not displayed]

FINDINGS: The lungs are clear. Heart size and pulmonary vascularity are
normal. No adenopathy. No pneumothorax. There is an old healed
fracture of the right clavicle. No acute fracture evident.
IMPRESSION: No edema or consolidation.

## 2020-09-09 ENCOUNTER — Ambulatory Visit
Admission: EM | Admit: 2020-09-09 | Discharge: 2020-09-09 | Disposition: A | Discharge status: Home / Self Care | Payer: Medicaid Other | Attending: Family Medicine | Admitting: Family Medicine

## 2020-09-09 DIAGNOSIS — J029 Acute pharyngitis, unspecified: Secondary | ICD-10-CM | POA: Diagnosis not present

## 2020-09-09 MED ORDER — LIDOCAINE VISCOUS HCL 2 % MT SOLN: 10.0000 mL | OROMUCOSAL | 0 refills | Status: DC | PRN

## 2020-09-09 MED ORDER — AMOXICILLIN 875 MG PO TABS: 875.0000 mg | ORAL_TABLET | Freq: Two times a day (BID) | ORAL | 0 refills | Status: DC

## 2020-09-09 NOTE — ED Triage Notes (Signed)
Patient presents to Urgent Care with complaints of cough, sore throat since yesterday. Treating with robitussin and tylenol.   Denies fever.

## 2020-09-13 NOTE — ED Provider Notes (Signed)
[facility]    CSN: [id number] Arrival date & time: 09/09/20  1722      History   Chief Complaint Chief Complaint  Patient presents with   Sore Throat   Cough    HPI Katherine Webb is a 30 y.o. female.   Presenting today with 1 day history of cough, sore throat, rhinorrhea, fatigue but states at this time the sore, swollen throat is her main concern. Has a close family contact with strep pharyngitis and feels she may have this. Denies fever, chills, CP, SOB, abdominal pain, N/V/D. So far not trying much OTC as she is pregnant and was unsure what was safe thus far. No known chronic medical conditions per patient but per chart review hx of IV drug use, endocarditis. No known sick contacts recently.    Past Medical History:  Diagnosis Date   Endocarditis    Hepatitis C    IV drug user     There are no problems to display for this patient.   History reviewed. No pertinent surgical history.  OB History     Gravida  2   Para      Term      Preterm      AB      Living  1      SAB      IAB      Ectopic      Multiple      Live Births               Home Medications    Prior to Admission medications   Medication Sig Start Date End Date Taking? Authorizing Provider  amoxicillin (AMOXIL) 875 MG tablet Take 1 tablet (875 mg total) by mouth 2 (two) times daily. 09/09/20  Yes Particia Nearing, PA-C  lidocaine (XYLOCAINE) 2 % solution Use as directed 10 mLs in the mouth or throat as needed for mouth pain. 09/09/20  Yes Particia Nearing, PA-C  benzonatate (TESSALON PERLES) 100 MG capsule Take 1 capsule (100 mg total) by mouth every 6 (six) hours as needed for cough. 09/07/15   Rockne Menghini, MD  ibuprofen (ADVIL,MOTRIN) 800 MG tablet Take 1 tablet (800 mg total) by mouth every 8 (eight) hours as needed. 09/07/15   Rockne Menghini, MD  metroNIDAZOLE (METROGEL VAGINAL) 0.75 % vaginal gel Place 1 Applicatorful vaginally at bedtime.  09/19/15   Irean Hong, MD    Family History History reviewed. No pertinent family history.  Social History Social History   Tobacco Use   Smoking status: Every Day    Pack years: 0.00    Types: Cigarettes   Smokeless tobacco: Never  Vaping Use   Vaping Use: Never used  Substance Use Topics   Alcohol use: Yes   Drug use: No     Allergies   Patient has no known allergies.   Review of Systems Review of Systems PER HPI    Physical Exam Triage Vital Signs ED Triage Vitals  Enc Vitals Group     BP 09/09/20 1730 120/84     Pulse Rate 09/09/20 1730 (!) 115     Resp 09/09/20 1730 16     Temp 09/09/20 1730 97.9 F (36.6 C)     Temp Source 09/09/20 1730 Oral     SpO2 09/09/20 1730 96 %     Weight --      Height --      Head Circumference --      Peak  Flow --      Pain Score 09/09/20 1728 7     Pain Loc --      Pain Edu? --      Excl. in GC? --    No data found.  Updated Vital Signs BP 120/84 (BP Location: Left Arm)   Pulse (!) 115   Temp 97.9 F (36.6 C) (Oral)   Resp 16   SpO2 96%   Visual Acuity Right Eye Distance:   Left Eye Distance:   Bilateral Distance:    Right Eye Near:   Left Eye Near:    Bilateral Near:     Physical Exam Vitals and nursing note reviewed.  Constitutional:      Appearance: Normal appearance. She is not ill-appearing or diaphoretic.  HENT:     Head: Atraumatic.     Right Ear: Tympanic membrane normal.     Left Ear: Tympanic membrane normal.     Nose: Rhinorrhea present.     Mouth/Throat:     Mouth: Mucous membranes are moist.     Pharynx: Oropharyngeal exudate and posterior oropharyngeal erythema present.  Eyes:     Extraocular Movements: Extraocular movements intact.     Conjunctiva/sclera: Conjunctivae normal.  Cardiovascular:     Rate and Rhythm: Normal rate and regular rhythm.     Heart sounds: Normal heart sounds.  Pulmonary:     Effort: Pulmonary effort is normal.     Breath sounds: Normal breath sounds. No  wheezing or rales.  Abdominal:     General: Bowel sounds are normal.     Palpations: Abdomen is soft.  Musculoskeletal:        General: No swelling. Normal range of motion.     Cervical back: Normal range of motion and neck supple.  Lymphadenopathy:     Cervical: No cervical adenopathy.  Skin:    General: Skin is warm and dry.  Neurological:     General: No focal deficit present.     Mental Status: She is alert.     Motor: No weakness.     Gait: Gait normal.  Psychiatric:        Mood and Affect: Mood normal.        Thought Content: Thought content normal.        Judgment: Judgment normal.    UC Treatments / Results  Labs (all labs ordered are listed, but only abnormal results are displayed) Labs Reviewed - No data to display  EKG   Radiology No results found.  Procedures Procedures (including critical care time)  Medications Ordered in UC Medications - No data to display  Initial Impression / Assessment and Plan / UC Course  I have reviewed the triage vital signs and the nursing notes.  Pertinent labs & imaging results that were available during my care of the patient were reviewed by me and considered in my medical decision making (see chart for details).     Overall exam reassuring, mildly tachycardic in triage but improved on auscultation after resting for a while in exam room from triage. Patient declines COVID testing or strep testing today, given exam findings and close contact with strep pharyngitis will prescribe amoxil and viscous lidocaine, may start with viscous lidocaine and if not improving in next few days start amoxil. Discussed pregnancy safe OTC medications and close PCP f/u.   Final Clinical Impressions(s) / UC Diagnoses   Final diagnoses:  Acute pharyngitis, unspecified etiology   Discharge Instructions   None  ED Prescriptions     Medication Sig Dispense Auth. Provider   amoxicillin (AMOXIL) 875 MG tablet Take 1 tablet (875 mg total)  by mouth 2 (two) times daily. 20 tablet Particia Nearing, [place]   lidocaine (XYLOCAINE) 2 % solution Use as directed 10 mLs in the mouth or throat as needed for mouth pain. 100 mL Particia Nearing, [place]      PDMP not reviewed this encounter.   Roosvelt Maser [place], [place] 09/13/20 [phone]

## 2021-01-16 ENCOUNTER — Encounter (HOSPITAL_COMMUNITY): Payer: Self-pay | Admitting: Obstetrics & Gynecology

## 2021-01-16 ENCOUNTER — Inpatient Hospital Stay (HOSPITAL_COMMUNITY)
Admission: AD | Admit: 2021-01-16 | Discharge: 2021-01-16 | Disposition: A | Discharge status: Home / Self Care | Payer: Medicaid Other | Attending: Obstetrics & Gynecology | Admitting: Obstetrics & Gynecology

## 2021-01-16 DIAGNOSIS — Z3A32 32 weeks gestation of pregnancy: Secondary | ICD-10-CM | POA: Diagnosis not present

## 2021-01-16 DIAGNOSIS — O4703 False labor before 37 completed weeks of gestation, third trimester: Secondary | ICD-10-CM | POA: Diagnosis not present

## 2021-01-16 DIAGNOSIS — Z3689 Encounter for other specified antenatal screening: Secondary | ICD-10-CM

## 2021-01-16 LAB — URINALYSIS, ROUTINE W REFLEX MICROSCOPIC
Bilirubin Urine: NEGATIVE
Glucose, UA: NEGATIVE mg/dL
Hgb urine dipstick: NEGATIVE
Ketones, ur: NEGATIVE mg/dL
Leukocytes,Ua: NEGATIVE
Nitrite: NEGATIVE
Protein, ur: NEGATIVE mg/dL
Specific Gravity, Urine: 1.023 (ref 1.005–1.030)
pH: 5 (ref 5.0–8.0)

## 2021-01-16 LAB — RAPID URINE DRUG SCREEN, HOSP PERFORMED
Amphetamines: NOT DETECTED
Barbiturates: POSITIVE — AB
Benzodiazepines: POSITIVE — AB
Cocaine: POSITIVE — AB
Opiates: NOT DETECTED
Tetrahydrocannabinol: POSITIVE — AB

## 2021-01-16 NOTE — MAU Provider Note (Signed)
History     CSN: [id number]  Arrival date and time: 01/16/21 0455   Event Date/Time   First Provider Initiated Contact with Patient 01/16/21 0530      Chief Complaint  Patient presents with   Contractions   Katherine Webb is a 30 y.o. [id number] at [redacted]w[redacted]d who receives care at [facility] in [facility].  She presents today, via EMS, for Contractions.  She states she was putting her one-year old to bed and started having sharp pains when trying to bend over. She states after that she started having contractions that were every 4 minutes.  She states the pain was in the "lower area of my stomach" and she rates them a 6/10.  She endorses fetal movement.  She denies vaginal bleeding, leaking, or discharge.    OB History     Gravida  6   Para  3   Term  0   Preterm  3   AB  2   Living  3      SAB      IAB      Ectopic      Multiple      Live Births              Past Medical History:  Diagnosis Date   Endocarditis    Hepatitis C    IV drug user     No past surgical history on file.  No family history on file.  Social History   Tobacco Use   Smoking status: Every Day    Types: Cigarettes   Smokeless tobacco: Never  Vaping Use   Vaping Use: Never used  Substance Use Topics   Alcohol use: Yes   Drug use: No    Allergies: No Known Allergies  Medications Prior to Admission  Medication Sig Dispense Refill Last Dose   ursodiol (ACTIGALL) 300 MG capsule Take 300 mg by mouth 3 (three) times daily.   01/15/2021   amoxicillin (AMOXIL) 875 MG tablet Take 1 tablet (875 mg total) by mouth 2 (two) times daily. (Patient not taking: Reported on 01/16/2021) 20 tablet 0 Not Taking   benzonatate (TESSALON PERLES) 100 MG capsule Take 1 capsule (100 mg total) by mouth every 6 (six) hours as needed for cough. (Patient not taking: Reported on 01/16/2021) 15 capsule 0 Not Taking   ibuprofen (ADVIL,MOTRIN) 800 MG tablet Take 1 tablet (800 mg total) by mouth every 8  (eight) hours as needed. (Patient not taking: Reported on 01/16/2021) 20 tablet 0 Not Taking   lidocaine (XYLOCAINE) 2 % solution Use as directed 10 mLs in the mouth or throat as needed for mouth pain. 100 mL 0    metroNIDAZOLE (METROGEL VAGINAL) 0.75 % vaginal gel Place 1 Applicatorful vaginally at bedtime. (Patient not taking: Reported on 01/16/2021) 70 g 1 Not Taking    Review of Systems  Constitutional:  Negative for chills and fever.  Eyes:  Negative for visual disturbance.  Gastrointestinal:  Positive for abdominal pain. Negative for nausea and vomiting.  Genitourinary:  Negative for difficulty urinating and dysuria.  Neurological:  Negative for dizziness, light-headedness and headaches.  Physical Exam   Blood pressure 119/74, pulse 82, unknown if currently breastfeeding.  Physical Exam Vitals reviewed.  Constitutional:      Appearance: Normal appearance.  HENT:     Head: Normocephalic and atraumatic.  Eyes:     Conjunctiva/sclera: Conjunctivae normal.  Cardiovascular:     Rate and Rhythm: Normal rate and regular rhythm.  Heart sounds: Normal heart sounds.  Pulmonary:     Effort: Pulmonary effort is normal. No respiratory distress.     Breath sounds: Normal breath sounds.  Abdominal:     Tenderness: There is no abdominal tenderness.     Comments: Appears Gravid, AGA  Genitourinary:    Comments: Dilation: Closed Exam by:: Gerrit Heck, CNM  Musculoskeletal:        General: Normal range of motion.     Cervical back: Normal range of motion.  Skin:    General: Skin is warm and dry.  Neurological:     Mental Status: She is alert and oriented to person, place, and time.  Psychiatric:        Behavior: Behavior is slowed.     Comments: Slurred speech    Fetal Assessment 125 bpm, Mod Var, -Decels, +Accels Toco: Irritability  MAU Course   Results for orders placed or performed during the hospital encounter of 01/16/21 (from the past 24 hour(s))  Urinalysis,  Routine w reflex microscopic Urine, Clean Catch     Status: None   Collection Time: 01/16/21  6:37 AM  Result Value Ref Range   Color, Urine YELLOW YELLOW   APPearance CLEAR CLEAR   Specific Gravity, Urine 1.023 1.005 - 1.030   pH 5.0 5.0 - 8.0   Glucose, UA NEGATIVE NEGATIVE mg/dL   Hgb urine dipstick NEGATIVE NEGATIVE   Bilirubin Urine NEGATIVE NEGATIVE   Ketones, ur NEGATIVE NEGATIVE mg/dL   Protein, ur NEGATIVE NEGATIVE mg/dL   Nitrite NEGATIVE NEGATIVE   Leukocytes,Ua NEGATIVE NEGATIVE  Rapid urine drug screen (hospital performed)     Status: Abnormal   Collection Time: 01/16/21  6:38 AM  Result Value Ref Range   Opiates NONE DETECTED NONE DETECTED   Cocaine POSITIVE (A) NONE DETECTED   Benzodiazepines POSITIVE (A) NONE DETECTED   Amphetamines NONE DETECTED NONE DETECTED   Tetrahydrocannabinol POSITIVE (A) NONE DETECTED   Barbiturates POSITIVE (A) NONE DETECTED   No results found.  MDM PE Labs:UA, UDS EFM Assessment and Plan  30 year old [id number]  SIUP at 32.3 weeks Cat I FT Preterm Contractions   -Exam performed and findings discussed. -Reassured that cervix is closed and no signs of active labor. -Patient denies consumption of drugs, but has slurred speech and unable to keep eyes open.  -Will perform UDS once patient able to leave specimen. -NST Reactive.    Cherre Robins MSN, CNM 01/16/2021, 5:30 AM   Reassessment (6:43 AM) -Patient reports she is no longer feeling contractions. -Able to leave urine sample -Patient requesting discharge. -Discharge order placed along with UA and UDS.  Addendum (8:52 AM) UDS as above.  Cherre Robins MSN, CNM Advanced Practice Provider, Center for [facility], Center for [employer]

## 2021-01-16 NOTE — MAU Note (Signed)
Vaginal and abdominal pressure ago, had some clear liquid discharge ago. No bleeding.

## 2021-01-22 ENCOUNTER — Emergency Department (HOSPITAL_BASED_OUTPATIENT_CLINIC_OR_DEPARTMENT_OTHER)
Admission: EM | Admit: 2021-01-22 | Discharge: 2021-01-22 | Disposition: A | Discharge status: Home / Self Care | Payer: Medicaid Other | Attending: Emergency Medicine | Admitting: Emergency Medicine

## 2021-01-22 ENCOUNTER — Encounter (HOSPITAL_BASED_OUTPATIENT_CLINIC_OR_DEPARTMENT_OTHER): Payer: Self-pay | Admitting: Emergency Medicine

## 2021-01-22 ENCOUNTER — Other Ambulatory Visit: Payer: Self-pay

## 2021-01-22 DIAGNOSIS — F17210 Nicotine dependence, cigarettes, uncomplicated: Secondary | ICD-10-CM | POA: Diagnosis not present

## 2021-01-22 DIAGNOSIS — H61001 Unspecified perichondritis of right external ear: Secondary | ICD-10-CM | POA: Diagnosis not present

## 2021-01-22 DIAGNOSIS — H9202 Otalgia, left ear: Secondary | ICD-10-CM | POA: Diagnosis present

## 2021-01-22 MED ORDER — ACETAMINOPHEN 500 MG PO TABS
1000.0000 mg | ORAL_TABLET | Freq: Once | ORAL | Status: AC
  Administered 2021-01-22: 1000 mg via ORAL
  Filled 2021-01-22: qty 2

## 2021-01-22 MED ORDER — CLINDAMYCIN HCL 150 MG PO CAPS: 300.0000 mg | ORAL_CAPSULE | Freq: Three times a day (TID) | ORAL | 0 refills | Status: AC

## 2021-01-22 MED ORDER — CEFDINIR 300 MG PO CAPS: 300.0000 mg | ORAL_CAPSULE | Freq: Two times a day (BID) | ORAL | 0 refills | Status: AC

## 2021-01-22 NOTE — ED Provider Notes (Signed)
[facility] EMERGENCY DEPARTMENT Provider Note   CSN: [id number] Arrival date & time: 01/22/21  1439     History Chief Complaint  Patient presents with   Otalgia    Katherine Webb is a 30 y.o. female.  HPI     30 year old female with a history of IV drug use, endocarditis, currently [redacted] weeks pregnant with cholestasis and preeclampsia, presents with concern for left ear pain and swelling after having an ear piercing earlier this week.  Reports that she had her left ear cartilage pierced earlier this week, and a few days later began to develop increased redness and swelling.  She took the ear piercing out on Wednesday, however the pain and swelling continue to worsen.  The pain is now severe, time 10, and she feels it radiates down towards her jaw.  She denies any known fevers.  Reports with the ear pain she feels that she does have some headache, but she also reports she has had a history of headaches and is being followed for preeclampsia with her high risk OB at [facility].  Reports she had more significant leg swelling yesterday, but denies leg swelling today.  Denies any leakage of fluid, change in contractions, or vaginal bleeding.  Reports that she had just been seen by her OB and had evaluation.  She denies any significant or painful contractions or regular contractions, reports that have been in her peritoneum and she had seen her OB regarding this and had also mentioned to them that your pain at the time.  She is not yet on antibiotics.  She has seen some green discharge coming from the area.  She has tried Tylenol without relief.  Past Medical History:  Diagnosis Date   Endocarditis    Hepatitis C    IV drug user     There are no problems to display for this patient.   Past Surgical History:  Procedure Laterality Date   CHOLECYSTECTOMY       OB History     Gravida  6   Para  3   Term  0   Preterm  3   AB  2   Living  3      SAB      IAB       Ectopic      Multiple      Live Births              No family history on file.  Social History   Tobacco Use   Smoking status: Every Day    Types: Cigarettes   Smokeless tobacco: Never  Vaping Use   Vaping Use: Never used  Substance Use Topics   Alcohol use: Not Currently   Drug use: No    Home Medications Prior to Admission medications   Medication Sig Start Date End Date Taking? Authorizing Provider  cefdinir (OMNICEF) 300 MG capsule Take 1 capsule (300 mg total) by mouth 2 (two) times daily for 7 days. 01/22/21 01/29/21 Yes Alvira Monday, MD  clindamycin (CLEOCIN) 150 MG capsule Take 2 capsules (300 mg total) by mouth 3 (three) times daily for 7 days. 01/22/21 01/29/21 Yes Alvira Monday, MD  ursodiol (ACTIGALL) 300 MG capsule Take 300 mg by mouth 3 (three) times daily.    [provider]    Allergies    Patient has no known allergies.  Review of Systems   Review of Systems  Constitutional:  Negative for fever.  HENT:  Negative for  sore throat.   Eyes:  Negative for visual disturbance.  Respiratory:  Negative for cough and shortness of breath.   Cardiovascular:  Negative for chest pain. Leg swelling: decreased from prior. Gastrointestinal:  Negative for abdominal pain, nausea and vomiting.  Genitourinary:  Negative for difficulty urinating.  Musculoskeletal:  Negative for back pain and neck pain.  Skin:  Negative for rash.  Neurological:  Positive for headaches (no change in baseline). Negative for syncope.   Physical Exam Updated Vital Signs BP 136/74   Pulse 78   Temp 97.9 F (36.6 C)   Resp 16   Ht 5\' 1"  (1.549 m)   Wt 73.9 kg   SpO2 97%   BMI 30.80 kg/m   Physical Exam Vitals and nursing note reviewed.  Constitutional:      General: She is not in acute distress.    Appearance: She is well-developed. She is not diaphoretic.  HENT:     Head: Normocephalic and atraumatic.     Comments: See photo, erythema, tenderness to  right ear Eyes:     Conjunctiva/sclera: Conjunctivae normal.  Cardiovascular:     Rate and Rhythm: Normal rate and regular rhythm.     Heart sounds: Normal heart sounds. No murmur heard.   No friction rub. No gallop.  Pulmonary:     Effort: Pulmonary effort is normal. No respiratory distress.     Breath sounds: Normal breath sounds. No wheezing or rales.  Abdominal:     General: There is no distension.     Palpations: Abdomen is soft.     Tenderness: There is no abdominal tenderness. There is no guarding.     Comments: Gravid, nontender  Musculoskeletal:        General: No tenderness.     Cervical back: Normal range of motion.  Skin:    General: Skin is warm and dry.     Findings: No erythema or rash.  Neurological:     Mental Status: She is alert and oriented to person, place, and time.       ED Results / Procedures / Treatments   Labs (all labs ordered are listed, but only abnormal results are displayed) Labs Reviewed - No data to display  EKG None  Radiology No results found.  Procedures Procedures     Medications Ordered in ED Medications  acetaminophen (TYLENOL) tablet 1,000 mg (1,000 mg Oral Given 01/22/21 1608)    ED Course  I have reviewed the triage vital signs and the nursing notes.  Pertinent labs & imaging results that were available during my care of the patient were reviewed by me and considered in my medical decision making (see chart for details).    MDM Rules/Calculators/A&P                               30 year old female with a history of IV drug use, endocarditis, currently [redacted] weeks pregnant with cholestasis and preeclampsia, presents with concern for left ear pain and swelling after having an ear piercing earlier this week.  Exam concerning for perichondritis, do not see signs of clear abscess.  Discussed with ENT on-call, Dr. 37, who recommends antibiotics with Pseudomonas coverage, and discussed with infectious disease  pharmacy.  Given patient is pregnant, we do not recommend ciprofloxacin, and she otherwise does not have options for oral therapy for Pseudomonas coverage for perichondritis.  While cipro has not been shown per studies to cause problems in  human pregnancies, it has been harmful in animal studies and given risk we cannot recommend its use at this time.  Discussed given the infection there, high risk in pregnancy, recommend admission for IV antibiotics that would be appropriate and cover pseudomonas.  She declines this, and would like treatment with outpatient antibiotics.  She did also declines treatment with IV antibiotics while she is in the emergency department prior to discharge.  She is given a prescription for clindamycin and Omnicef and given number for follow-up with ENT.  Recommend continued close follow-up with her OB/GYN given her high risk pregnancy, ongoing signs of mild preeclampsia-BP elevated in ED but not severe and improved spontaneously. Understands recommendation to stay, risks of worsening infection, sepsis, bacteremia, and reasons to return to the ED if symptoms and infection not improving with oral antibiotics.   Final Clinical Impression(s) / ED Diagnoses Final diagnoses:  Perichondritis of auricle, right    Rx / DC Orders ED Discharge Orders          Ordered    clindamycin (CLEOCIN) 150 MG capsule  3 times daily        01/22/21 1638    cefdinir (OMNICEF) 300 MG capsule  2 times daily        01/22/21 1638             Alvira Monday, MD 01/24/21 [phone]

## 2021-01-22 NOTE — ED Triage Notes (Signed)
Pt had left ear cartilage piercing that caused redness and swelling that spreads down left jaw line. Pt is [redacted] weeks pregnant with history of preeclampsia.

## 2022-06-30 ENCOUNTER — Ambulatory Visit (HOSPITAL_COMMUNITY)
Admission: EM | Admit: 2022-06-30 | Discharge: 2022-06-30 | Disposition: A | Discharge status: Home / Self Care | Payer: BLUE CROSS/BLUE SHIELD | Attending: Family | Admitting: Family

## 2022-06-30 DIAGNOSIS — F1099 Alcohol use, unspecified with unspecified alcohol-induced disorder: Secondary | ICD-10-CM | POA: Diagnosis present

## 2022-06-30 DIAGNOSIS — F1090 Alcohol use, unspecified, uncomplicated: Secondary | ICD-10-CM

## 2022-06-30 DIAGNOSIS — F1190 Opioid use, unspecified, uncomplicated: Secondary | ICD-10-CM | POA: Diagnosis not present

## 2022-06-30 DIAGNOSIS — F1110 Opioid abuse, uncomplicated: Secondary | ICD-10-CM | POA: Insufficient documentation

## 2022-06-30 NOTE — ED Provider Notes (Signed)
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 female. Patient presents voluntarily to [facility] behavioral health for walk-in assessment.  Patient is accompanied by family member who does not remain present during assessment,  Patient is assessed, face-to-face, by nurse practitioner. She is seated in assessment area, no acute distress. Consulted with provider, Dr.  Dwyane Dee, and chart reviewed on 06/30/2022. She  is alert and oriented, cooperative during assessment.   Shaylynn seeking substance use treatment options today.  She endorses history of alcohol and fentanyl use.  Typically using fentanyl up to 2 g/day for the last 12 days.  She uses alcohol, amount of alcohol use varies.  She relapsed on alcohol and fentanyl 2 weeks ago after sober 8 years.  She has been compliant with Subutex prescribed at family [facility] in [place] prior to relapse 2 weeks ago.  She will consider both residential and outpatient substance use treatment options.  Patient  presents with anxious mood, congruent affect. She  denies suicidal and homicidal ideations. Denies history of suicide attempts, denies history of self-harm.  Patient easily  contracts verbally for safety with this Probation officer.    Patient has normal speech and behavior.  She denies auditory and visual hallucinations.  Patient is able to converse coherently with goal-directed thoughts and no distractibility or preoccupation.  Denies symptoms of paranoia.  Objectively there is no evidence of psychosis/mania or delusional thinking.  Clearence Ped resides in [place] with family, she denies access to weapons. She is not currently employed. Patient endorses average sleep and appetite.  Patient  offered support and encouragement.  She declines any person to contact for collateral information at this time.   Patient and family are educated and verbalize understanding of mental health resources and other crisis services in the community. They are instructed to call 911 and present to the nearest emergency room should patient experience any suicidal/homicidal ideation, auditory/visual/hallucinations, or detrimental worsening of mental health condition.      [place] ED from 06/30/2022 in [facility] ED from 01/22/2021 in [facility] Emergency Department at [facility] Admission (Discharged) from 01/16/2021 in [place] Assessment Unit  C-SSRS RISK CATEGORY No Risk No Risk No Risk       Psychiatric Specialty Exam  Presentation  General Appearance:Appropriate for Environment; Casual  Eye Contact:Fair  Speech:Clear and Coherent; Normal Rate  Speech Volume:Normal  Handedness:Right   Mood and Affect  Mood: Anxious  Affect: Congruent   Thought Process  Thought Processes: Coherent; Goal Directed; Linear  Descriptions of Associations:Intact  Orientation:Full (Time, Place and Person)  Thought Content:Logical; WDL    Hallucinations:None  Ideas of Reference:None  Suicidal Thoughts:No  Homicidal Thoughts:No   Sensorium  Memory: Immediate Good; Recent Good  Judgment: Fair  Insight: Fair   Community education officer  Concentration: Good  Attention Span: Good  Recall: Good  Fund of Knowledge: Good  Language: Good   Psychomotor Activity  Psychomotor Activity: Restlessness   Assets  Assets: Communication Skills; Desire for Improvement; Housing; Physical Health; Resilience; Social Support   Sleep  Sleep: Fair  Number of hours: No data recorded  Physical Exam: Physical Exam Vitals and nursing note reviewed.  Constitutional:      Appearance: Normal appearance. She is well-developed.  HENT:     Head: Normocephalic and atraumatic.     Nose: Nose normal.  Cardiovascular:     Rate and Rhythm: Normal rate.  Pulmonary:     Effort: Pulmonary effort is normal.  Musculoskeletal:        General: Normal range of motion.     Cervical back: Normal range of motion.  Skin:    General: Skin is warm and dry.  Neurological:     Mental Status: She is alert and oriented to person, place, and time.  Psychiatric:        Attention and Perception: Attention and perception normal.        Mood and Affect: Affect normal. Mood is anxious.        Speech: Speech normal.        Behavior: Behavior normal. Behavior is cooperative.        Thought Content: Thought content normal.        Cognition and Memory: Cognition and memory normal.    Review of Systems  Constitutional: Negative.   HENT: Negative.    Eyes: Negative.   Respiratory: Negative.    Cardiovascular: Negative.   Gastrointestinal: Negative.   Genitourinary: Negative.   Musculoskeletal: Negative.   Skin: Negative.   Neurological: Negative.   Psychiatric/Behavioral:  Positive for substance abuse. The patient is nervous/anxious.    Blood pressure (!) 162/99, pulse 77, temperature 97.9 F (36.6 C), temperature source Oral, resp. rate 18, SpO2 99 %, unknown if currently breastfeeding. There is no height or weight on file to calculate BMI.  Musculoskeletal: Strength & Muscle Tone: within normal limits Gait & Station: normal Patient leans: N/A   [place] MSE Discharge Disposition for Follow up and Recommendations: Based on my evaluation the patient does not appear to have an emergency medical condition and can be discharged with resources and follow up care in outpatient services for Medication Management and Individual Therapy Follow-up with outpatient psychiatry resources provided.  Follow-up with substance use treatment resources provided.  Lucky Rathke, FNP 06/30/2022, 12:36 PM

## 2022-06-30 NOTE — Discharge Instructions (Addendum)
Patient is instructed prior to discharge to:  Take all medications as prescribed by his/her mental healthcare provider. Report any adverse effects and or reactions from the medicines to his/her outpatient provider promptly. Keep all scheduled appointments, to ensure that you are getting refills on time and to avoid any interruption in your medication.  If you are unable to keep an appointment call to reschedule.  Be sure to follow-up with resources and follow-up appointments provided.  Patient has been instructed & cautioned: To not engage in alcohol and or illegal drug use while on prescription medicines. In the event of worsening symptoms, patient is instructed to call the crisis hotline, 911 and or go to the nearest ED for appropriate evaluation and treatment of symptoms. To follow-up with his/her primary care provider for your other medical issues, concerns and or health care needs.  Information: -National Suicide Prevention Lifeline 1-800-SUICIDE or 1-800-273-8255.  -988 offers 24/7 access to trained crisis counselors who can help people experiencing mental health-related distress. People can call or text 988 or chat 988lifeline.org for themselves or if they are worried about a loved one who may need crisis support.      Substance Abuse Treatment Resources listed Below:  [facility] - Admissions are currently completed Monday through Friday at 8am; both appointments and walk-ins are accepted.  Any individual that is a [place] resident may present for a substance abuse screening and assessment for admission.  A person may be referred by numerous sources or self-refer.   Potential clients will be screened for medical necessity and appropriateness for the program.  Clients must meet criteria for high-intensity residential treatment services.  If clinically appropriate, a client will continue with the comprehensive clinical assessment and intake process, as well  as enrollment in the [facility].  Address: [address] [place], [place] [address] Admin Hours: Mon-Fri 8AM to 5PM Center Hours: 24/7 Phone: [phone] Fax: [phone]  [facility] Address: [address], [place], [place] [address] [facility] ([facility]) Hours: 24/7 Phone: [phone] Fax: [phone]  [facility]): (offers outpatient therapy and intensive outpatient substance abuse therapy).  [address], [place], [place] [address] Phone: [phone]  [facility]: Offers FREE recovery skills classes, support groups, 1:1 Peer Support, and Compeer Classes. [address], [place], [place] [address] Phone: [phone] (Call to complete intake).   [facility] [address] [place], [place] [address] Phone: [phone] [facility] provides food, shelter and other programs and services to the homeless men of [place] through our men's program.  By offering safe shelter, three meals a day, clean clothing, Biblical counseling, financial planning, vocational training, GED/education and employment assistance, we've helped mend the shattered lives of many homeless men since opening in 1974.  We have approximately 267 beds available, with a max of 312 beds including mats for emergency situations and currently house an average of 270 men a night.  Prospective Client Check-In Information Photo ID Required (State/ Out of State/ DOC) - if photo ID is not available, clients are required to have a printout of a police/sheriff's criminal history report. Help out with chores around the Mission. No sex offender of any type (pending, charged, registered and/or any other sex related offenses) will be permitted to check in. Must be willing to abide by all rules, regulations, and policies established by the [facility]. The  following   will be provided - shelter, food, clothing, and biblical counseling. If you or someone you know is in need of assistance at our men's shelter in [place], [place], please call [phone] ext. [phone].  [facility]-will provide timely access to mental health services for children and adolescents (4-17) and adults presenting in a mental health crisis. The program is designed for those who need urgent Behavioral Health or Substance Use treatment and are not experiencing a medical crisis that would typically require an emergency room visit.    [address] [place], [place] [address] Phone: [phone] [email or web address]  [facility]: Phone#: [phone]  The [facility] SA Intensive Outpatient Program (SAIOP) means structured individual and group addiction activities and services that are provided at an outpatient program designed to assist adult and adolescent consumers to begin recovery and learn skills for recovery maintenance. The [facility] SAIOP program is offered at least 3 hours a day, 3 days a week.SAIOP services shall include a structured program consisting of, but not limited to, the following services: Individual counseling and support; Group counseling and support; Family counseling, training or support; Biochemical assays to identify recent drug use (e.g., urine drug screens); Strategies for relapse prevention to include community and social support systems in treatment; Life skills; Crisis contingency planning; Disease Management; and Treatment support activities that have been adapted or specifically designed for persons with physical disabilities, or persons with co-occurring disorders of mental illness and substance abuse/dependence or mental retardation/developmental disability and substance abuse/dependence. Phone: [phone]  Address:   [facility] will also offer the following outpatient  services: (Monday through Friday 8am-5pm)   Partial Hospitalization Program (PHP) Substance Abuse Intensive Outpatient Program (SA-IOP) Group Therapy Medication Management Peer Living Room We also provide (24/7):  Assessments: Our mental health clinician and providers will conduct a focused mental health evaluation, assessing for immediate safety concerns and further mental health needs. Referral: Our team will provide resources and help connect to community based mental health treatment, when indicated, including psychotherapy, psychiatry, and other specialized behavioral health or substance use disorder services (for those not already in treatment). Transitional Care: Our team providers in person bridging and/or telephonic follow-up during the patient's transition to outpatient services.  [facility] 24-Hour Call Center: 1-800-256-2452 Behavioral Health Crisis Line: [phone]  

## 2022-06-30 NOTE — ED Triage Notes (Signed)
Pt presents to [facility] voluntarily accompanied by her boyfriend seeking detox for fentanyl and alcohol. Pt states she has been without fentanyl and alcohol for about 2 days. 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 Pt denies SI/HI and AVH.
